# Patient Record
Sex: Male | Born: 1960 | Race: Black or African American | Hispanic: No | Smoking: Current every day smoker
Health system: Southern US, Community
[De-identification: ages and names within clinical notes are randomized; demographics above are authoritative.]

---

## 2004-03-19 ENCOUNTER — Emergency Department (HOSPITAL_COMMUNITY): Admission: EM | Admit: 2004-03-19 | Discharge: 2004-03-19 | Payer: Self-pay | Admitting: Emergency Medicine

## 2004-12-12 ENCOUNTER — Observation Stay (HOSPITAL_COMMUNITY): Admission: EM | Admit: 2004-12-12 | Discharge: 2004-12-12 | Payer: Self-pay | Admitting: Emergency Medicine

## 2005-11-30 ENCOUNTER — Emergency Department (HOSPITAL_COMMUNITY): Admission: EM | Admit: 2005-11-30 | Discharge: 2005-11-30 | Payer: Self-pay | Admitting: Emergency Medicine

## 2006-09-18 ENCOUNTER — Emergency Department (HOSPITAL_COMMUNITY): Admission: EM | Admit: 2006-09-18 | Discharge: 2006-09-18 | Payer: Self-pay | Admitting: Emergency Medicine
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
## 2008-11-07 ENCOUNTER — Emergency Department (HOSPITAL_COMMUNITY): Admission: EM | Admit: 2008-11-07 | Discharge: 2008-11-07 | Payer: Self-pay | Admitting: Emergency Medicine

## 2008-12-02 ENCOUNTER — Emergency Department (HOSPITAL_COMMUNITY): Admission: EM | Admit: 2008-12-02 | Discharge: 2008-12-02 | Payer: Self-pay | Admitting: Emergency Medicine

## 2010-08-23 LAB — CBC
HCT: 36.9 % — ABNORMAL LOW (ref 39.0–52.0)
Hemoglobin: 13.1 g/dL (ref 13.0–17.0)
MCHC: 35.4 g/dL (ref 30.0–36.0)
MCV: 86.5 fL (ref 78.0–100.0)
Platelets: 313 10*3/uL (ref 150–400)
RBC: 4.26 MIL/uL (ref 4.22–5.81)
RDW: 14.7 % (ref 11.5–15.5)
WBC: 9.2 10*3/uL (ref 4.0–10.5)

## 2010-08-23 LAB — BASIC METABOLIC PANEL
BUN: 17 mg/dL (ref 6–23)
CO2: 22 mEq/L (ref 19–32)
Calcium: 9.2 mg/dL (ref 8.4–10.5)
Chloride: 104 mEq/L (ref 96–112)
Creatinine, Ser: 1.77 mg/dL — ABNORMAL HIGH (ref 0.4–1.5)
GFR calc Af Amer: 51 mL/min — ABNORMAL LOW (ref >60)
GFR calc non Af Amer: 42 mL/min — ABNORMAL LOW (ref >60)
Glucose, Bld: 81 mg/dL (ref 70–99)
Potassium: 3.8 mEq/L (ref 3.5–5.1)
Sodium: 135 mEq/L (ref 135–145)

## 2010-08-23 LAB — RAPID URINE DRUG SCREEN, HOSP PERFORMED
Amphetamines: NOT DETECTED
Barbiturates: NOT DETECTED
Benzodiazepines: NOT DETECTED
Cocaine: POSITIVE — AB
Opiates: NOT DETECTED
Tetrahydrocannabinol: NOT DETECTED

## 2010-08-23 LAB — DIFFERENTIAL
Basophils Absolute: 0 10*3/uL (ref 0.0–0.1)
Basophils Relative: 0 % (ref 0–1)
Eosinophils Absolute: 0.1 10*3/uL (ref 0.0–0.7)
Eosinophils Relative: 1 % (ref 0–5)
Lymphocytes Relative: 22 % (ref 12–46)
Lymphs Abs: 2 10*3/uL (ref 0.7–4.0)
Monocytes Absolute: 0.6 10*3/uL (ref 0.1–1.0)
Monocytes Relative: 6 % (ref 3–12)
Neutro Abs: 6.5 10*3/uL (ref 1.7–7.7)
Neutrophils Relative %: 71 % (ref 43–77)

## 2010-08-23 LAB — URINALYSIS, ROUTINE W REFLEX MICROSCOPIC
Bilirubin Urine: NEGATIVE
Glucose, UA: NEGATIVE mg/dL
Ketones, ur: NEGATIVE mg/dL
Leukocytes, UA: NEGATIVE
Nitrite: NEGATIVE
Protein, ur: NEGATIVE mg/dL
Specific Gravity, Urine: <1.005 — ABNORMAL LOW (ref 1.005–1.030)
Urobilinogen, UA: 0.2 mg/dL (ref 0.0–1.0)
pH: 5 (ref 5.0–8.0)

## 2010-08-23 LAB — URINE MICROSCOPIC-ADD ON

## 2010-08-23 LAB — ETHANOL
Alcohol, Ethyl (B): 134 mg/dL — ABNORMAL HIGH (ref 0–10)
Alcohol, Ethyl (B): 215 mg/dL — ABNORMAL HIGH (ref 0–10)
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
## 2015-06-24 ENCOUNTER — Telehealth: Payer: Self-pay | Admitting: Orthopaedic Surgery

## 2015-06-24 MED ORDER — HYDROCODONE-ACETAMINOPHEN 7.5-325 MG PO TABS: 1.0000 | ORAL_TABLET | ORAL | Status: DC | PRN

## 2015-06-24 NOTE — Telephone Encounter (Signed)
Rx printer

## 2015-06-24 NOTE — Telephone Encounter (Signed)
Patient given prescription by Dr. Hilda Lias

## 2015-07-23 ENCOUNTER — Telehealth: Payer: Self-pay | Admitting: Orthopaedic Surgery

## 2015-07-23 MED ORDER — HYDROCODONE-ACETAMINOPHEN 7.5-325 MG PO TABS: 1.0000 | ORAL_TABLET | ORAL | Status: DC | PRN

## 2015-07-23 NOTE — Telephone Encounter (Signed)
Patient requested refill on Norco 7.5-325 mgs.  Qty 120  This was last filled on 06-24-15

## 2015-07-23 NOTE — Telephone Encounter (Signed)
Rx done. 

## 2015-08-20 ENCOUNTER — Other Ambulatory Visit: Payer: Self-pay | Admitting: Orthopaedic Surgery

## 2015-08-20 MED ORDER — HYDROCODONE-ACETAMINOPHEN 7.5-325 MG PO TABS: 1.0000 | ORAL_TABLET | ORAL | Status: DC | PRN

## 2015-09-17 ENCOUNTER — Telehealth: Payer: Self-pay | Admitting: Orthopaedic Surgery

## 2015-09-17 MED ORDER — HYDROCODONE-ACETAMINOPHEN 7.5-325 MG PO TABS: 1.0000 | ORAL_TABLET | ORAL | Status: DC | PRN

## 2015-09-17 NOTE — Telephone Encounter (Signed)
Pt requests Norco 7.5-325 mgs.

## 2015-09-17 NOTE — Telephone Encounter (Signed)
Rx done. 

## 2015-10-21 ENCOUNTER — Telehealth: Payer: Self-pay | Admitting: Orthopaedic Surgery

## 2015-10-21 MED ORDER — HYDROCODONE-ACETAMINOPHEN 7.5-325 MG PO TABS: 1.0000 | ORAL_TABLET | ORAL | Status: DC | PRN

## 2015-10-21 NOTE — Telephone Encounter (Signed)
Hydrocodone-Acetaminophen 7.5/325mg Qty 120 Tablets °

## 2015-10-21 NOTE — Telephone Encounter (Signed)
Rx done. 

## 2015-11-23 ENCOUNTER — Telehealth: Payer: Self-pay | Admitting: Orthopaedic Surgery

## 2015-11-23 MED ORDER — HYDROCODONE-ACETAMINOPHEN 7.5-325 MG PO TABS: 1.0000 | ORAL_TABLET | ORAL | Status: DC | PRN

## 2015-11-23 NOTE — Telephone Encounter (Signed)
Hydrocodone-Acetaminophen 7.5/325mg Qty 120 Tablets °

## 2015-11-23 NOTE — Telephone Encounter (Signed)
Rx done. 

## 2015-12-17 ENCOUNTER — Telehealth: Payer: Self-pay | Admitting: Orthopaedic Surgery

## 2015-12-17 NOTE — Telephone Encounter (Signed)
Patient requested a refill on Norco 7.5-325 mgs.

## 2015-12-22 ENCOUNTER — Telehealth: Payer: Self-pay

## 2015-12-22 NOTE — Telephone Encounter (Signed)
Request refill hydrocodone. 

## 2015-12-23 MED ORDER — HYDROCODONE-ACETAMINOPHEN 7.5-325 MG PO TABS: 1.0000 | ORAL_TABLET | ORAL | 0 refills | Status: DC | PRN

## 2016-01-20 ENCOUNTER — Telehealth: Payer: Self-pay | Admitting: Orthopaedic Surgery

## 2016-01-20 NOTE — Telephone Encounter (Signed)
Hydrocodone-Acetaminophen 7.5/325mg Qty 120 Tablets °

## 2016-01-21 MED ORDER — HYDROCODONE-ACETAMINOPHEN 7.5-325 MG PO TABS: ORAL_TABLET | ORAL | 0 refills | Status: DC

## 2016-02-04 ENCOUNTER — Ambulatory Visit (INDEPENDENT_AMBULATORY_CARE_PROVIDER_SITE_OTHER): Payer: Medicare Other | Admitting: Orthopaedic Surgery

## 2016-02-04 ENCOUNTER — Encounter: Payer: Self-pay | Admitting: Orthopaedic Surgery

## 2016-02-04 VITALS — BP 108/43 | HR 55 | Temp 97.7°F | Ht 72.0 in | Wt 149.0 lb

## 2016-02-04 DIAGNOSIS — M25561 Pain in right knee: Secondary | ICD-10-CM

## 2016-02-04 DIAGNOSIS — M25562 Pain in left knee: Secondary | ICD-10-CM | POA: Diagnosis not present

## 2016-02-04 DIAGNOSIS — F17210 Nicotine dependence, cigarettes, uncomplicated: Secondary | ICD-10-CM | POA: Diagnosis not present

## 2016-02-04 MED ORDER — HYDROCODONE-ACETAMINOPHEN 7.5-325 MG PO TABS: ORAL_TABLET | ORAL | 0 refills | Status: DC

## 2016-02-04 NOTE — Progress Notes (Signed)
Patient NW:GNFAO:Frank Baldwin, male DOB:12/04/1960, 55 y.o. ZHY:[id number]  Chief Complaint  Patient presents with  . Follow-up    knee pain    HPI  Frank FlamingSteve Baldwin is a 55 y.o. male who has bilateral knee pains.  He has no giving way or locking.  He has no redness.  He has some swelling and popping.  He has no new trauma. HPI  Body mass index is 20.21 kg/m.  ROS  Review of Systems  HENT: Negative for congestion.   Respiratory: Negative for cough and shortness of breath.   Cardiovascular: Negative for chest pain and leg swelling.  Endocrine: Negative for cold intolerance.  Musculoskeletal: Positive for arthralgias, gait problem and joint swelling.  Allergic/Immunologic: Negative for environmental allergies.    No past medical history on file.  No past surgical history on file.  Mother died of cancer, heart disease. Social History Social History  Substance Use Topics  . Smoking status: Current Every Day Smoker  . Smokeless tobacco: Never Used  . Alcohol use Not on file    No Known Allergies  Current Outpatient Prescriptions  Medication Sig Dispense Refill  . HYDROcodone-acetaminophen (NORCO) 7.5-325 MG tablet One every four hours for pain as needed.  Do not drive car or operate machinery while taking this medicine.  Must last 14 days. 56 tablet 0  . HYDROcodone-acetaminophen (NORCO) 7.5-325 MG tablet One every four hours for pain as needed.  Do not drive car or operate machinery while taking this medicine.  Must last 14 days. 56 tablet 0   No current facility-administered medications for this visit.      Physical Exam  Blood pressure (!) 108/43, pulse (!) 55, temperature 97.7 F (36.5 C), height 6' (1.829 m), weight 149 lb (67.6 kg).  Constitutional: overall normal hygiene, normal nutrition, well developed, normal grooming, normal body habitus. Assistive device:none  Musculoskeletal: gait and station Limp right, muscle tone and strength are normal, no tremors or atrophy  is present.  .  Neurological: coordination overall normal.  Deep tendon reflex/nerve stretch intact.  Sensation normal.  Cranial nerves II-XII intact.   Skin:   Normal overall no scars, lesions, ulcers or rashes. No psoriasis.  Psychiatric: Alert and oriented x 3.  Recent memory intact, remote memory unclear.  Normal mood and affect. Well groomed.  Good eye contact.  Cardiovascular: overall no swelling, no varicosities, no edema bilaterally, normal temperatures of the legs and arms, no clubbing, cyanosis and good capillary refill.  Lymphatic: palpation is normal.  The bilateral lower extremity is examined:  Inspection:  Thigh:  Non-tender and no defects  Knee does not have swelling 0+ effusion.                        Joint tenderness is present                        Patient is tender over the medial joint line  Lower Leg:  Has normal appearance and no tenderness or defects  Ankle:  Non-tender and no defects  Foot:  Non-tender and no defects Range of Motion:  Knee:  Range of motion is: full both knees                        Crepitus is  present  Ankle:  Range of motion is normal. Strength and Tone:  The bilateral lower extremity has normal strength and tone. Stability:  Knee:  The knee is stable.  Ankle:  The ankle is stable.    The patient has been educated about the nature of the problem(s) and counseled on treatment options.  The patient appeared to understand what I have discussed and is in agreement with it.  Encounter Diagnoses  Name Primary?  . Bilateral knee pain Yes  . Cigarette nicotine dependence without complication     PLAN Call if any problems.  Precautions discussed.  Continue current medications.   Return to clinic 3 months   Electronically Signed Darreld Mclean, MD 9/21/20173:45 PM

## 2016-02-18 ENCOUNTER — Telehealth: Payer: Self-pay | Admitting: Orthopedic Surgery

## 2016-02-18 ENCOUNTER — Other Ambulatory Visit: Payer: Self-pay | Admitting: *Deleted

## 2016-02-18 MED ORDER — HYDROCODONE-ACETAMINOPHEN 7.5-325 MG PO TABS: ORAL_TABLET | ORAL | 0 refills | Status: DC

## 2016-02-18 NOTE — Telephone Encounter (Signed)
Dr. Sanjuan DameKeeling's patient requests a refill on Hydrocodone/Acetaminophen (Norco)  7.5-325 mgs.   Qty  56       Sig: One every four hours for pain as needed. Do not drive car or operate machinery while taking this medicine. Must last 14 days.

## 2016-03-03 ENCOUNTER — Telehealth: Payer: Self-pay | Admitting: Orthopaedic Surgery

## 2016-03-03 MED ORDER — HYDROCODONE-ACETAMINOPHEN 7.5-325 MG PO TABS: ORAL_TABLET | ORAL | 0 refills | Status: DC

## 2016-03-03 NOTE — Telephone Encounter (Signed)
Patient called and requested a refill on Hydrocodone/Acetaminophen (Norco)  7.5-325 mgs.   Qty  56  Sig: One every four hours for pain as needed. Do not drive car or operate machinery while taking this medicine. Must last 14 days.

## 2016-03-23 ENCOUNTER — Telehealth: Payer: Self-pay | Admitting: Orthopaedic Surgery

## 2016-03-23 MED ORDER — HYDROCODONE-ACETAMINOPHEN 7.5-325 MG PO TABS: ORAL_TABLET | ORAL | 0 refills | Status: DC

## 2016-03-23 NOTE — Telephone Encounter (Signed)
Hydrocodone-Acetaminophen  7.5/325mg  Qty 56 Tablets °

## 2016-03-28 ENCOUNTER — Encounter: Payer: Self-pay | Admitting: Orthopaedic Surgery

## 2016-04-05 ENCOUNTER — Telehealth: Payer: Self-pay | Admitting: Orthopaedic Surgery

## 2016-04-05 MED ORDER — HYDROCODONE-ACETAMINOPHEN 7.5-325 MG PO TABS: ORAL_TABLET | ORAL | 0 refills | Status: DC

## 2016-04-05 NOTE — Telephone Encounter (Signed)
Hydrocodone-Acetaminophen   7.5/325mg   Qty 52 Tablets

## 2016-04-20 ENCOUNTER — Telehealth: Payer: Self-pay | Admitting: Orthopaedic Surgery

## 2016-04-20 MED ORDER — HYDROCODONE-ACETAMINOPHEN 7.5-325 MG PO TABS: ORAL_TABLET | ORAL | 0 refills | Status: DC

## 2016-04-20 NOTE — Telephone Encounter (Signed)
Hydrocodone-Acetaminophen   7.5/325mg  Qty 52 Tablets °

## 2016-05-04 ENCOUNTER — Ambulatory Visit (INDEPENDENT_AMBULATORY_CARE_PROVIDER_SITE_OTHER): Payer: Medicare Other | Admitting: Orthopaedic Surgery

## 2016-05-04 DIAGNOSIS — G8929 Other chronic pain: Secondary | ICD-10-CM

## 2016-05-04 DIAGNOSIS — M25561 Pain in right knee: Secondary | ICD-10-CM

## 2016-05-04 DIAGNOSIS — M25562 Pain in left knee: Secondary | ICD-10-CM

## 2016-05-04 MED ORDER — HYDROCODONE-ACETAMINOPHEN 7.5-325 MG PO TABS: ORAL_TABLET | ORAL | 0 refills | Status: DC

## 2016-05-05 ENCOUNTER — Ambulatory Visit: Payer: Medicare Other | Admitting: Orthopaedic Surgery

## 2016-05-11 NOTE — Progress Notes (Signed)
[name]:Frank Baldwin, male DOB:06/27/1960, 55 y.o. AVW:[id number]  No chief complaint on file.   HPI  Camillo FlamingSteve Bowen is a 55 y.o. male who has chronic bilateral knee pain that is stable.  He has no giving way, no locking HPI  There is no height or weight on file to calculate BMI.  ROS  Review of Systems  No past medical history on file.  No past surgical history on file.  No family history on file.  Social History Social History  Substance Use Topics  . Smoking status: Current Every Day Smoker  . Smokeless tobacco: Never Used  . Alcohol use Not on file    No Known Allergies  Current Outpatient Prescriptions  Medication Sig Dispense Refill  . HYDROcodone-acetaminophen (NORCO) 7.5-325 MG tablet One every six hours for pain as needed.  Do not drive car or operate machinery while taking this medicine.  Must last 14 days. 52 tablet 0   No current facility-administered medications for this visit.      Physical Exam  There were no vitals taken for this visit.  Constitutional: overall normal hygiene, normal nutrition, well developed, normal grooming, normal body habitus. Assistive device:none  Musculoskeletal: gait and station Limp none, muscle tone and strength are normal, no tremors or atrophy is present.  .  Neurological: coordination overall normal.  Deep tendon reflex/nerve stretch intact.  Sensation normal.  Cranial nerves II-XII intact.   Skin:   Normal overall no scars, lesions, ulcers or rashes. No psoriasis.  Psychiatric: Alert and oriented x 3.  Recent memory intact, remote memory unclear.  Normal mood and affect. Well groomed.  Good eye contact.  Cardiovascular: overall no swelling, no varicosities, no edema bilaterally, normal temperatures of the legs and arms, no clubbing, cyanosis and good capillary refill.  Lymphatic: palpation is normal.  Knees have full motion but crepitus in the right knee and sensitive.  Gait is normal.  NV intact.  The patient  has been educated about the nature of the problem(s) and counseled on treatment options.  The patient appeared to understand what I have discussed and is in agreement with it.  Encounter Diagnosis  Name Primary?  . Chronic pain of both knees Yes    PLAN Call if any problems.  Precautions discussed.  Continue current medications.   Return to clinic 3 months   Electronically Signed Darreld McleanWayne Mishawn Hemann, MD 12/27/20172:35 PM

## 2016-05-18 ENCOUNTER — Telehealth: Payer: Self-pay | Admitting: Orthopaedic Surgery

## 2016-05-18 MED ORDER — HYDROCODONE-ACETAMINOPHEN 7.5-325 MG PO TABS: ORAL_TABLET | ORAL | 0 refills | Status: DC

## 2016-05-18 NOTE — Telephone Encounter (Signed)
Hydrocodone-Acetaminophen   7.5/325mg  Qty 52 Tablets °

## 2016-06-03 ENCOUNTER — Telehealth: Payer: Self-pay | Admitting: Orthopaedic Surgery

## 2016-06-03 MED ORDER — HYDROCODONE-ACETAMINOPHEN 7.5-325 MG PO TABS: ORAL_TABLET | ORAL | 0 refills | Status: DC

## 2016-06-03 NOTE — Telephone Encounter (Signed)
Patient requests refill on Hydrocodone/Acetaminophen (Norco)  7.5-325  Mgs.  Qty  52   Sig: One every six hours for pain as needed. Do not drive car or operate machinery while taking this medicine. Must last 14 days.

## 2016-06-16 ENCOUNTER — Telehealth: Payer: Self-pay | Admitting: Orthopaedic Surgery

## 2016-06-16 MED ORDER — HYDROCODONE-ACETAMINOPHEN 7.5-325 MG PO TABS: ORAL_TABLET | ORAL | 0 refills | Status: DC

## 2016-06-16 NOTE — Telephone Encounter (Signed)
Patient requests refill on Hydrocodone/Acetaminophen 7.5-325  Mgs.   Qty  52   Sig: One every six hours for pain as needed. Do not drive car or operate machinery while taking this medicine. Must last 14 days.

## 2016-06-30 ENCOUNTER — Telehealth: Payer: Self-pay | Admitting: Orthopaedic Surgery

## 2016-06-30 MED ORDER — HYDROCODONE-ACETAMINOPHEN 7.5-325 MG PO TABS: ORAL_TABLET | ORAL | 0 refills | Status: DC

## 2016-06-30 NOTE — Telephone Encounter (Signed)
Patient called and requested a refill on Hydrocodone/Acetaminophen (Norco)  7.5-*325   Mgs.   Qty  52       Sig: One every six hours for pain as needed. Do not drive car or operate machinery while taking this medicine. Must last 14 days.

## 2016-07-14 ENCOUNTER — Telehealth: Payer: Self-pay | Admitting: Orthopaedic Surgery

## 2016-07-14 MED ORDER — HYDROCODONE-ACETAMINOPHEN 7.5-325 MG PO TABS: ORAL_TABLET | ORAL | 0 refills | Status: DC

## 2016-07-14 NOTE — Telephone Encounter (Signed)
Patient requests a refill on Hydrocodone/Acetaminophen 7.5-325  Mgs.   Qty  50   Sig: One every six hours for pain as needed. Do not drive car or operate machinery while taking this medicine. Must last 14 days.

## 2016-07-28 ENCOUNTER — Encounter: Payer: Self-pay | Admitting: Orthopaedic Surgery

## 2016-07-28 ENCOUNTER — Ambulatory Visit (INDEPENDENT_AMBULATORY_CARE_PROVIDER_SITE_OTHER): Payer: Medicare Other | Admitting: Orthopaedic Surgery

## 2016-07-28 VITALS — BP 128/72 | HR 50 | Ht 73.0 in | Wt 153.0 lb

## 2016-07-28 DIAGNOSIS — S61412A Laceration without foreign body of left hand, initial encounter: Secondary | ICD-10-CM

## 2016-07-28 DIAGNOSIS — F17210 Nicotine dependence, cigarettes, uncomplicated: Secondary | ICD-10-CM | POA: Diagnosis not present

## 2016-07-28 MED ORDER — CEPHALEXIN 500 MG PO CAPS: 500.0000 mg | ORAL_CAPSULE | Freq: Four times a day (QID) | ORAL | 0 refills | Status: DC

## 2016-07-28 MED ORDER — HYDROCODONE-ACETAMINOPHEN 7.5-325 MG PO TABS: ORAL_TABLET | ORAL | 0 refills | Status: DC

## 2016-07-28 NOTE — Progress Notes (Addendum)
Patient Frank Baldwin:[name], male DOB:06/20/1960, 56 y.o. AVW:[id number]  Chief Complaint  Patient presents with  . Hand Injury    LEFT HAND LACERATION, DOI 07/25/16    HPI  Frank Baldwin is a 56 y.o. male who cut his hand about ten to eleven days ago on the left in the volar area at the base of the ring finger.  It was a deep cut.  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  He has used peroxide and alcohol to the area.  He has been concerned because it has a little odor now and it is not coming together.  He feels it should have had sutures but he is afraid of needles.  He is a smoker and not willing to quit. HPI  Body mass index is 20.19 kg/m.  ROS  Review of Systems  HENT: Negative for congestion.   Respiratory: Negative for cough and shortness of breath.   Cardiovascular: Negative for chest pain and leg swelling.  Endocrine: Negative for cold intolerance.  Musculoskeletal: Positive for arthralgias, gait problem and joint swelling.  Allergic/Immunologic: Negative for environmental allergies.    No past medical history on file.  No past surgical history on file.  No family history on file.  Social History Social History  Substance Use Topics  . Smoking status: Current Every Day Smoker  . Smokeless tobacco: Never Used  . Alcohol use Not on file    No Known Allergies  Current Outpatient Prescriptions  Medication Sig Dispense Refill  . cephALEXin (KEFLEX) 500 MG capsule Take 1 capsule (500 mg total) by mouth 4 (four) times daily. 40 capsule 0  . HYDROcodone-acetaminophen (NORCO) 7.5-325 MG tablet One every six hours for pain as needed.  Do not drive car or operate machinery while taking this medicine.  Must last 14 days. 50 tablet 0   No current facility-administered medications for this visit.      Physical Exam  Blood pressure 128/72, pulse (!) 50, height 6\' 1"  (1.854 m), weight 153 lb (69.4 kg).  Constitutional: overall normal  hygiene, normal nutrition, well developed, normal grooming, normal body habitus. Assistive device:none  Musculoskeletal: gait and station Limp none, muscle tone and strength are normal, no tremors or atrophy is present.  .  Neurological: coordination overall normal.  Deep tendon reflex/nerve stretch intact.  Sensation normal.  Cranial nerves II-XII intact.   Skin:   Normal overall no scars, lesions, ulcers or rashes. No psoriasis.  Psychiatric: Alert and oriented x 3.  Recent memory intact, remote memory unclear.  Normal mood and affect. Well groomed.  Good eye contact.  Cardiovascular: overall no swelling, no varicosities, no edema bilaterally, normal temperatures of the legs and arms, no clubbing, cyanosis and good capillary refill.  Lymphatic: palpation is normal.  He has a deep cut volar side of the left nondominant hand that is at the base of the ring finger and extending deep into the web between the ring and the little finger.  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  He has no ulcers, color is normal.  There is no odor noted but it was just cleansed. No purulence is present. ROM of the ring is good as the other fingers and thumb.  There is no swelling of the palm or fluctuance.  There is no swelling of the wrist.  The patient has been educated about  the nature of the problem(s) and counseled on treatment options.  The patient appeared to understand what I have discussed and is in agreement with it.  Encounter Diagnoses  Name Primary?  . Laceration of left hand without foreign body, initial encounter Yes  . Cigarette nicotine dependence without complication     PLAN Call if any problems.  Precautions discussed. I will begin Keflex.  Return to clinic 1 week  I have reviewed the [facility] Controlled Substance Reporting System web site prior to prescribing narcotic medicine for this  patient.   Electronically Signed Darreld Mclean, MD 3/15/201811:51 AM

## 2016-08-04 ENCOUNTER — Ambulatory Visit (INDEPENDENT_AMBULATORY_CARE_PROVIDER_SITE_OTHER): Payer: Medicare Other | Admitting: Orthopaedic Surgery

## 2016-08-04 ENCOUNTER — Encounter: Payer: Self-pay | Admitting: Orthopaedic Surgery

## 2016-08-04 VITALS — BP 115/70 | HR 52 | Temp 97.3°F | Ht 73.0 in | Wt 150.0 lb

## 2016-08-04 DIAGNOSIS — S61412A Laceration without foreign body of left hand, initial encounter: Secondary | ICD-10-CM | POA: Diagnosis not present

## 2016-08-04 NOTE — Progress Notes (Signed)
Patient Frank Baldwin, male DOB:03/29/1961, 56 y.o. NWG:[id number]  Chief Complaint  Patient presents with  . Follow-up    left hand laceration    HPI  Camillo FlamingSteve Fusselman is a 56 y.o. male who had a deep laceration of the left hand at the base of the ring finger.  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  He has been on the Keflex and has been doing wound care. He has no odor of the finger today and no discharge.  HPI  Body mass index is 19.79 kg/m.  ROS  Review of Systems  HENT: Negative for congestion.   Respiratory: Negative for cough and shortness of breath.   Cardiovascular: Negative for chest pain and leg swelling.  Endocrine: Negative for cold intolerance.  Musculoskeletal: Positive for arthralgias, gait problem and joint swelling.  Allergic/Immunologic: Negative for environmental allergies.    No past medical history on file.  No past surgical history on file.  No family history on file.  Social History Social History  Substance Use Topics  . Smoking status: Current Every Day Smoker  . Smokeless tobacco: Never Used  . Alcohol use Not on file    No Known Allergies  Current Outpatient Prescriptions  Medication Sig Dispense Refill  . cephALEXin (KEFLEX) 500 MG capsule Take 1 capsule (500 mg total) by mouth 4 (four) times daily. 40 capsule 0  . HYDROcodone-acetaminophen (NORCO) 7.5-325 MG tablet One every six hours for pain as needed.  Do not drive car or operate machinery while taking this medicine.  Must last 14 days. 50 tablet 0   No current facility-administered medications for this visit.      Physical Exam  Blood pressure 115/70, pulse (!) 52, temperature 97.3 F (36.3 C), height 6\' 1"  (1.854 m), weight 150 lb (68 kg).  Constitutional: overall normal hygiene, normal nutrition, well developed, normal grooming, normal body habitus. Assistive device:none  Musculoskeletal: gait and  station Limp none, muscle tone and strength are normal, no tremors or atrophy is present.  .  Neurological: coordination overall normal.  Deep tendon reflex/nerve stretch intact.  Sensation normal.  Cranial nerves II-XII intact.   Skin:   Normal overall no scars, lesions, ulcers or rashes. No psoriasis.  Psychiatric: Alert and oriented x 3.  Recent memory intact, remote memory unclear.  Normal mood and affect. Well groomed.  Good eye contact.  Cardiovascular: overall no swelling, no varicosities, no edema bilaterally, normal temperatures of the legs and arms, no clubbing, cyanosis and good capillary refill.  Lymphatic: palpation is normal.  The deep laceration of the left hand on volar side of the ring finger is not red, there is no discharge and no odor.  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    The patient has been educated about the nature of the problem(s) and counseled on treatment options.  The patient appeared to understand what I have discussed and is in agreement with it.  Encounter Diagnosis  Name Primary?  . Laceration of left hand without foreign body, initial encounter Yes    PLAN Call if any problems.  Precautions discussed.  Continue current medications.   Return to clinic 2 weeks   Continue antibiotics until gone.  Electronically Signed Darreld McleanWayne Kanton Kamel, MD 3/22/20183:43 PM

## 2016-08-04 NOTE — Patient Instructions (Signed)
Steps to Quit Smoking Smoking tobacco can be bad for your health. It can also affect almost every organ in your body. Smoking puts you and people around you at risk for many serious long-lasting (chronic) diseases. Quitting smoking is hard, but it is one of the best things that you can do for your health. It is never too late to quit. What are the benefits of quitting smoking? When you quit smoking, you lower your risk for getting serious diseases and conditions. They can include:  Lung cancer or lung disease.  Heart disease.  Stroke.  Heart attack.  Not being able to have children (infertility).  Weak bones (osteoporosis) and broken bones (fractures). If you have coughing, wheezing, and shortness of breath, those symptoms may get better when you quit. You may also get sick less often. If you are pregnant, quitting smoking can help to lower your chances of having a baby of low birth weight. What can I do to help me quit smoking? Talk with your doctor about what can help you quit smoking. Some things you can do (strategies) include:  Quitting smoking totally, instead of slowly cutting back how much you smoke over a period of time.  Going to in-person counseling. You are more likely to quit if you go to many counseling sessions.  Using resources and support systems, such as:  Online chats with a counselor.  Phone quitlines.  Printed self-help materials.  Support groups or group counseling.  Text messaging programs.  Mobile phone apps or applications.  Taking medicines. Some of these medicines may have nicotine in them. If you are pregnant or breastfeeding, do not take any medicines to quit smoking unless your doctor says it is okay. Talk with your doctor about counseling or other things that can help you. Talk with your doctor about using more than one strategy at the same time, such as taking medicines while you are also going to in-person counseling. This can help make quitting  easier. What things can I do to make it easier to quit? Quitting smoking might feel very hard at first, but there is a lot that you can do to make it easier. Take these steps:  Talk to your family and friends. Ask them to support and encourage you.  Call phone quitlines, reach out to support groups, or work with a counselor.  Ask people who smoke to not smoke around you.  Avoid places that make you want (trigger) to smoke, such as:  Bars.  Parties.  Smoke-break areas at work.  Spend time with people who do not smoke.  Lower the stress in your life. Stress can make you want to smoke. Try these things to help your stress:  Getting regular exercise.  Deep-breathing exercises.  Yoga.  Meditating.  Doing a body scan. To do this, close your eyes, focus on one area of your body at a time from head to toe, and notice which parts of your body are tense. Try to relax the muscles in those areas.  Download or buy apps on your mobile phone or tablet that can help you stick to your quit plan. There are many free apps, such as QuitGuide from the CDC (Centers for Disease Control and Prevention). You can find more support from smokefree.gov and other websites. This information is not intended to replace advice given to you by your health care provider. Make sure you discuss any questions you have with your health care provider. Document Released: 02/26/2009 Document Revised: 12/29/2015 Document   Reviewed: 09/16/2014 Elsevier Interactive Patient Education  2017 Elsevier Inc.  

## 2016-08-11 ENCOUNTER — Telehealth: Payer: Self-pay | Admitting: Orthopaedic Surgery

## 2016-08-11 MED ORDER — HYDROCODONE-ACETAMINOPHEN 7.5-325 MG PO TABS: ORAL_TABLET | ORAL | 0 refills | Status: DC

## 2016-08-11 NOTE — Telephone Encounter (Signed)
Hydrocodone-Acetaminophen  7.5/325 mg  Qty 50 Tablets °

## 2016-08-18 ENCOUNTER — Ambulatory Visit: Payer: Medicare Other | Admitting: Orthopaedic Surgery

## 2016-08-30 ENCOUNTER — Ambulatory Visit (INDEPENDENT_AMBULATORY_CARE_PROVIDER_SITE_OTHER): Payer: Medicare Other | Admitting: Orthopaedic Surgery

## 2016-08-30 ENCOUNTER — Encounter: Payer: Self-pay | Admitting: Orthopaedic Surgery

## 2016-08-30 VITALS — BP 102/76 | HR 82 | Temp 97.9°F | Ht 73.0 in | Wt 149.0 lb

## 2016-08-30 DIAGNOSIS — G8929 Other chronic pain: Secondary | ICD-10-CM | POA: Diagnosis not present

## 2016-08-30 DIAGNOSIS — M25561 Pain in right knee: Secondary | ICD-10-CM

## 2016-08-30 DIAGNOSIS — M25562 Pain in left knee: Secondary | ICD-10-CM

## 2016-08-30 DIAGNOSIS — S61412D Laceration without foreign body of left hand, subsequent encounter: Secondary | ICD-10-CM

## 2016-08-30 DIAGNOSIS — S61412A Laceration without foreign body of left hand, initial encounter: Secondary | ICD-10-CM

## 2016-08-30 DIAGNOSIS — S61419A Laceration without foreign body of unspecified hand, initial encounter: Secondary | ICD-10-CM | POA: Insufficient documentation

## 2016-08-30 MED ORDER — HYDROCODONE-ACETAMINOPHEN 7.5-325 MG PO TABS: ORAL_TABLET | ORAL | 0 refills | Status: DC

## 2016-08-30 NOTE — Progress Notes (Signed)
Patient Frank Baldwin, male DOB:1961/04/14, 56 y.o. [id number]  Chief Complaint  Patient presents with  . Follow-up    LEFT HAND LACERATION    HPI  Harper Smoker is a 56 y.o. male who had a deep laceration to the left hand near the ring finger into the palm.  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  He has been doing well.  The hand has healed completely.  His knees are not that tender today. HPI  Body mass index is 19.66 kg/m.  ROS  Review of Systems  HENT: Negative for congestion.   Respiratory: Negative for cough and shortness of breath.   Cardiovascular: Negative for chest pain and leg swelling.  Endocrine: Negative for cold intolerance.  Musculoskeletal: Positive for arthralgias, gait problem and joint swelling.  Allergic/Immunologic: Negative for environmental allergies.    No past medical history on file.  No past surgical history on file.  No family history on file.  Social History Social History  Substance Use Topics  . Smoking status: Current Every Day Smoker  . Smokeless tobacco: Never Used  . Alcohol use Not on file    No Known Allergies  Current Outpatient Prescriptions  Medication Sig Dispense Refill  . cephALEXin (KEFLEX) 500 MG capsule Take 1 capsule (500 mg total) by mouth 4 (four) times daily. 40 capsule 0  . HYDROcodone-acetaminophen (NORCO) 7.5-325 MG tablet One every six hours for pain as needed.  Do not drive car or operate machinery while taking this medicine.  Must last 14 days. 45 tablet 0   No current facility-administered medications for this visit.      Physical Exam  Blood pressure 102/76, pulse 82, temperature 97.9 F (36.6 C), height  (1.854 m), weight 149 lb (67.6 kg).  Constitutional: overall normal hygiene, normal nutrition, well developed, normal grooming, normal body habitus. Assistive device:none  Musculoskeletal: gait and station Limp none, muscle tone and strength are normal, no tremors  or atrophy is present.  .  Neurological: coordination overall normal.  Deep tendon reflex/nerve stretch intact.  Sensation normal.  Cranial nerves II-XII intact.   Skin:   Normal overall no scars, lesions, ulcers or rashes. No psoriasis.  Psychiatric: Alert and oriented x 3.  Recent memory intact, remote memory unclear.  Normal mood and affect. Well groomed.  Good eye contact.  Cardiovascular: overall no swelling, no varicosities, no edema bilaterally, normal temperatures of the legs and arms, no clubbing, cyanosis and good capillary refill.  Lymphatic: palpation is normal.  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  The patient appeared to understand what I have discussed and is in agreement with it.  Encounter Diagnoses  Name Primary?  . Laceration of left hand without foreign body, initial encounter Yes  . Chronic pain of both knees     PLAN Call if any problems.  Precautions discussed.  Continue current medications.   Return to clinic 3 months   I have reviewed the [facility] Controlled Substance Reporting System web site prior to prescribing narcotic medicine for this patient.  Electronically Signed Darreld Mclean, MD 4/17/20183:56 PM

## 2016-09-22 ENCOUNTER — Telehealth: Payer: Self-pay | Admitting: Orthopaedic Surgery

## 2016-09-22 MED ORDER — HYDROCODONE-ACETAMINOPHEN 7.5-325 MG PO TABS: ORAL_TABLET | ORAL | 0 refills | Status: DC

## 2016-09-22 NOTE — Telephone Encounter (Signed)
Patient requests refill on Hydrocodone/Acetaminophen (Norco)  7.5-325  mgs.   Qty  45       Sig: One every six hours for pain as needed. Do not drive car or operate machinery while taking this medicine. Must last 14 days.

## 2016-10-06 ENCOUNTER — Telehealth: Payer: Self-pay | Admitting: Orthopaedic Surgery

## 2016-10-06 MED ORDER — HYDROCODONE-ACETAMINOPHEN 7.5-325 MG PO TABS: ORAL_TABLET | ORAL | 0 refills | Status: DC

## 2016-10-06 NOTE — Telephone Encounter (Signed)
Patient requests refill on Hydrocodone/Acetamiophen 7.5-325 mgs.  Qty  45  Sig: One every six hours for pain as needed. Do not drive car or operate machinery while taking this medicine. Must last 14 days

## 2016-10-27 ENCOUNTER — Telehealth: Payer: Self-pay | Admitting: Orthopaedic Surgery

## 2016-10-27 NOTE — Telephone Encounter (Signed)
Patient requests refill on Hydrocodone/Acetaminophen (Norco)  Qty  42  Sig: One every six hours for pain as needed. Do not drive car or operate machinery while taking this medicine. Must last 14 days.

## 2016-10-31 MED ORDER — HYDROCODONE-ACETAMINOPHEN 7.5-325 MG PO TABS: ORAL_TABLET | ORAL | 0 refills | Status: DC

## 2016-11-23 ENCOUNTER — Telehealth: Payer: Self-pay | Admitting: Orthopaedic Surgery

## 2016-11-23 NOTE — Telephone Encounter (Signed)
Patient requests refill on Hydrocodone/Acetaminophen 7.5-325 mgs.   Qty  38   Sig: One every six hours for pain as needed. Do not drive car or operate machinery while taking this medicine. Must last 14 days.

## 2016-11-24 MED ORDER — HYDROCODONE-ACETAMINOPHEN 7.5-325 MG PO TABS: ORAL_TABLET | ORAL | 0 refills | Status: DC

## 2016-11-29 ENCOUNTER — Ambulatory Visit: Payer: Medicare Other | Admitting: Orthopaedic Surgery

## 2017-02-22 ENCOUNTER — Ambulatory Visit: Payer: Medicare Other | Admitting: Orthopaedic Surgery

## 2017-02-22 ENCOUNTER — Encounter: Payer: Self-pay | Admitting: Orthopaedic Surgery

## 2017-02-28 ENCOUNTER — Ambulatory Visit (INDEPENDENT_AMBULATORY_CARE_PROVIDER_SITE_OTHER): Payer: Medicare Other | Admitting: Orthopaedic Surgery

## 2017-02-28 ENCOUNTER — Encounter: Payer: Self-pay | Admitting: Orthopaedic Surgery

## 2017-02-28 VITALS — BP 114/74 | HR 46 | Temp 96.8°F | Ht 73.0 in | Wt 150.0 lb

## 2017-02-28 DIAGNOSIS — M25561 Pain in right knee: Secondary | ICD-10-CM | POA: Diagnosis not present

## 2017-02-28 DIAGNOSIS — G5622 Lesion of ulnar nerve, left upper limb: Secondary | ICD-10-CM | POA: Diagnosis not present

## 2017-02-28 DIAGNOSIS — M25562 Pain in left knee: Secondary | ICD-10-CM

## 2017-02-28 DIAGNOSIS — F17210 Nicotine dependence, cigarettes, uncomplicated: Secondary | ICD-10-CM | POA: Diagnosis not present

## 2017-02-28 DIAGNOSIS — G8929 Other chronic pain: Secondary | ICD-10-CM

## 2017-02-28 MED ORDER — HYDROCODONE-ACETAMINOPHEN 7.5-325 MG PO TABS: ORAL_TABLET | ORAL | 0 refills | Status: DC

## 2017-02-28 NOTE — Progress Notes (Signed)
Patient Frank Baldwin, male DOB:1961-01-03, 56 y.o. [id number]  Chief Complaint  Patient presents with  . Follow-up    Left hand    HPI  Maxie Slovacek is a 56 y.o. 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  He had a deep laceration of the left hand that has resolved.  He has pain of both knees with crepitus and swelling.  He has no new trauma, no giving way or redness.  He has more pain of the right knee.   HPI  Body mass index is 19.79 kg/m.  ROS  Review of Systems  HENT: Negative for congestion.   Respiratory: Negative for cough and shortness of breath.   Cardiovascular: Negative for chest pain and leg swelling.  Endocrine: Negative for cold intolerance.  Musculoskeletal: Positive for arthralgias, gait problem and joint swelling.  Allergic/Immunologic: Negative for environmental allergies.    History reviewed. No pertinent past medical history.  History reviewed. No pertinent surgical history.  History reviewed. No pertinent family history.  Social History Social History  Substance Use Topics  . Smoking status: Current Every Day Smoker  . Smokeless tobacco: Never Used  . Alcohol use Not on file    No Known Allergies  Current Outpatient Prescriptions  Medication Sig Dispense Refill  . cephALEXin (KEFLEX) 500 MG capsule Take 1 capsule (500 mg total) by mouth 4 (four) times daily. 40 capsule 0  . HYDROcodone-acetaminophen (NORCO) 7.5-325 MG tablet One every six hours for pain as needed.  Do not drive car or operate machinery while taking this medicine.  Must last 14 days. 38 tablet 0   No current facility-administered medications for this visit.      Physical Exam  Blood pressure 114/74, pulse (!) 46, temperature (!) 96.8 F (36 C), height  (1.854 m), weight 150 lb (68 kg).  Constitutional: overall normal hygiene, normal nutrition, well developed, normal grooming, normal body habitus. Assistive  device:none  Musculoskeletal: gait and station Limp right, muscle tone and strength are normal, no tremors or atrophy is present.  .  Neurological: coordination overall normal.  Deep tendon reflex/nerve stretch intact.  Sensation normal except for the left hand which has no ulnar nerve sensation and defect flexion of the little finger.  Cranial nerves II-XII intact.   Skin:   Normal overall no scars, lesions, ulcers or rashes. No psoriasis.  Psychiatric: Alert and oriented x 3.  Recent memory intact, remote memory unclear.  Normal mood and affect. Well groomed.  Good eye contact.  Cardiovascular: overall no swelling, no varicosities, no edema bilaterally, normal temperatures of the legs and arms, no clubbing, cyanosis and good capillary refill.  Lymphatic: palpation is normal.  The bilateral lower extremity is examined:  Inspection:  Thigh:  Non-tender and no defects  Knee has swelling 1/2+ effusion.                        Joint tenderness is present                        Patient is tender over the medial joint line  Lower Leg:  Has normal appearance and no tenderness or defects  Ankle:  Non-tender and no defects  Foot:  Non-tender and no defects Range of Motion:  Knee:  Range of motion is: 0-110 right, 0 to 115 left  Crepitus is  present  Ankle:  Range of motion is normal. Strength and Tone:  The bilateral lower extremity has normal strength and tone. Stability:  Knee:  The knee is stable.  Ankle:  The ankle is stable.   All other systems reviewed and are negative   The patient has been educated about the nature of the problem(s) and counseled on treatment options.  The patient appeared to understand what I have discussed and is in agreement with it.  Encounter Diagnoses  Name Primary?  . Chronic pain of both knees Yes  . 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 Cigarette nicotine dependence without complication     PLAN Call if any  problems.  Precautions discussed.  Continue current medications.   Return to clinic 3 months   I have reviewed the [facility] Controlled Substance Reporting System web site prior to prescribing narcotic medicine for this patient.  Electronically Signed Darreld Mclean, MD 10/16/20188:42 AM

## 2017-02-28 NOTE — Patient Instructions (Signed)
Steps to Quit Smoking Smoking tobacco can be bad for your health. It can also affect almost every organ in your body. Smoking puts you and people around you at risk for many serious [place]-lasting (chronic) diseases. Quitting smoking is hard, but it is one of the best things that you can do for your health. It is never too late to quit. What are the benefits of quitting smoking? When you quit smoking, you lower your risk for getting serious diseases and conditions. They can include:  Lung cancer or lung disease.  Heart disease.  Stroke.  Heart attack.  Not being able to have children (infertility).  Weak bones (osteoporosis) and broken bones (fractures).  If you have coughing, wheezing, and shortness of breath, those symptoms may get better when you quit. You may also get sick less often. If you are pregnant, quitting smoking can help to lower your chances of having a baby of low birth weight. What can I do to help me quit smoking? Talk with your doctor about what can help you quit smoking. Some things you can do (strategies) include:  Quitting smoking totally, instead of slowly cutting back how much you smoke over a period of time.  Going to in-person counseling. You are more likely to quit if you go to many counseling sessions.  Using resources and support systems, such as: ? Online chats with a counselor. ? Phone quitlines. ? Printed self-help materials. ? Support groups or group counseling. ? Text messaging programs. ? Mobile phone apps or applications.  Taking medicines. Some of these medicines may have nicotine in them. If you are pregnant or breastfeeding, do not take any medicines to quit smoking unless your doctor says it is okay. Talk with your doctor about counseling or other things that can help you.  Talk with your doctor about using more than one strategy at the same time, such as taking medicines while you are also going to in-person counseling. This can help make  quitting easier. What things can I do to make it easier to quit? Quitting smoking might feel very hard at first, but there is a lot that you can do to make it easier. Take these steps:  Talk to your family and friends. Ask them to support and encourage you.  Call phone quitlines, reach out to support groups, or work with a counselor.  Ask people who smoke to not smoke around you.  Avoid places that make you want (trigger) to smoke, such as: ? Bars. ? Parties. ? Smoke-break areas at work.  Spend time with people who do not smoke.  Lower the stress in your life. Stress can make you want to smoke. Try these things to help your stress: ? Getting regular exercise. ? Deep-breathing exercises. ? Yoga. ? Meditating. ? Doing a body scan. To do this, close your eyes, focus on one area of your body at a time from head to toe, and notice which parts of your body are tense. Try to relax the muscles in those areas.  Download or buy apps on your mobile phone or tablet that can help you stick to your quit plan. There are many free apps, such as QuitGuide from the CDC (Centers for Disease Control and Prevention). You can find more support from smokefree.gov and other websites.  This information is not intended to replace advice given to you by your health care provider. Make sure you discuss any questions you have with your health care provider. Document Released: 02/26/2009 Document   Revised: 12/29/2015 Document Reviewed: 09/16/2014 Elsevier Interactive Patient Education  2018 Elsevier Inc.  

## 2017-03-16 ENCOUNTER — Telehealth: Payer: Self-pay | Admitting: Orthopaedic Surgery

## 2017-03-16 MED ORDER — HYDROCODONE-ACETAMINOPHEN 7.5-325 MG PO TABS: ORAL_TABLET | ORAL | 0 refills | Status: DC

## 2017-03-16 NOTE — Telephone Encounter (Signed)
Hydrocodone-Acetaminophen  7.5/325 mg  Qty 38 Tablets °

## 2017-03-28 ENCOUNTER — Ambulatory Visit: Payer: Medicare Other | Admitting: Orthopaedic Surgery

## 2017-03-30 ENCOUNTER — Ambulatory Visit (INDEPENDENT_AMBULATORY_CARE_PROVIDER_SITE_OTHER): Payer: Medicare Other | Admitting: Orthopaedic Surgery

## 2017-03-30 ENCOUNTER — Encounter: Payer: Self-pay | Admitting: Orthopaedic Surgery

## 2017-03-30 VITALS — BP 133/85 | HR 55 | Temp 96.8°F | Ht 73.0 in | Wt 148.0 lb

## 2017-03-30 DIAGNOSIS — G5622 Lesion of ulnar nerve, left upper limb: Secondary | ICD-10-CM

## 2017-03-30 DIAGNOSIS — F17210 Nicotine dependence, cigarettes, uncomplicated: Secondary | ICD-10-CM | POA: Diagnosis not present

## 2017-03-30 MED ORDER — HYDROCODONE-ACETAMINOPHEN 7.5-325 MG PO TABS: ORAL_TABLET | ORAL | 0 refills | Status: DC

## 2017-03-30 NOTE — Patient Instructions (Signed)
Steps to Quit Smoking Smoking tobacco can be bad for your health. It can also affect almost every organ in your body. Smoking puts you and people around you at risk for many serious long-lasting (chronic) diseases. Quitting smoking is hard, but it is one of the best things that you can do for your health. It is never too late to quit. What are the benefits of quitting smoking? When you quit smoking, you lower your risk for getting serious diseases and conditions. They can include:  Lung cancer or lung disease.  Heart disease.  Stroke.  Heart attack.  Not being able to have children (infertility).  Weak bones (osteoporosis) and broken bones (fractures).  If you have coughing, wheezing, and shortness of breath, those symptoms may get better when you quit. You may also get sick less often. If you are pregnant, quitting smoking can help to lower your chances of having a baby of low birth weight. What can I do to help me quit smoking? Talk with your doctor about what can help you quit smoking. Some things you can do (strategies) include:  Quitting smoking totally, instead of slowly cutting back how much you smoke over a period of time.  Going to in-person counseling. You are more likely to quit if you go to many counseling sessions.  Using resources and support systems, such as: ? Online chats with a counselor. ? Phone quitlines. ? Printed self-help materials. ? Support groups or group counseling. ? Text messaging programs. ? Mobile phone apps or applications.  Taking medicines. Some of these medicines may have nicotine in them. If you are pregnant or breastfeeding, do not take any medicines to quit smoking unless your doctor says it is okay. Talk with your doctor about counseling or other things that can help you.  Talk with your doctor about using more than one strategy at the same time, such as taking medicines while you are also going to in-person counseling. This can help make  quitting easier. What things can I do to make it easier to quit? Quitting smoking might feel very hard at first, but there is a lot that you can do to make it easier. Take these steps:  Talk to your family and friends. Ask them to support and encourage you.  Call phone quitlines, reach out to support groups, or work with a counselor.  Ask people who smoke to not smoke around you.  Avoid places that make you want (trigger) to smoke, such as: ? Bars. ? Parties. ? Smoke-break areas at work.  Spend time with people who do not smoke.  Lower the stress in your life. Stress can make you want to smoke. Try these things to help your stress: ? Getting regular exercise. ? Deep-breathing exercises. ? Yoga. ? Meditating. ? Doing a body scan. To do this, close your eyes, focus on one area of your body at a time from head to toe, and notice which parts of your body are tense. Try to relax the muscles in those areas.  Download or buy apps on your mobile phone or tablet that can help you stick to your quit plan. There are many free apps, such as QuitGuide from the CDC (Centers for Disease Control and Prevention). You can find more support from smokefree.gov and other websites.  This information is not intended to replace advice given to you by your health care provider. Make sure you discuss any questions you have with your health care provider. Document Released: 02/26/2009 Document   Revised: 12/29/2015 Document Reviewed: 09/16/2014 Elsevier Interactive Patient Education  2018 Elsevier Inc.  

## 2017-03-30 NOTE — Progress Notes (Signed)
[name]:Frank Baldwin, male DOB:01/22/1961, 56 y.o. [id number]  Chief Complaint  Patient presents with  . Hand Pain    left    HPI  Camillo FlamingSteve Baldwin is a 56 y.o. 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  He had a deep laceration of the hand earlier this year and it has healed.  He has no new injury.  He has pain of the knees, more on the left. HPI  Body mass index is 19.53 kg/m.  ROS  Review of Systems  HENT: Negative for congestion.   Respiratory: Negative for cough and shortness of breath.   Cardiovascular: Negative for chest pain and leg swelling.  Endocrine: Negative for cold intolerance.  Musculoskeletal: Positive for arthralgias, gait problem and joint swelling.  Allergic/Immunologic: Negative for environmental allergies.    History reviewed. No pertinent past medical history.  History reviewed. No pertinent surgical history.  History reviewed. No pertinent family history.  Social History Social History   Tobacco Use  . Smoking status: Current Every Day Smoker  . Smokeless tobacco: Never Used  Substance Use Topics  . Alcohol use: Not on file  . Drug use: Not on file    No Known Allergies  Current Outpatient Medications  Medication Sig Dispense Refill  . HYDROcodone-acetaminophen (NORCO) 7.5-325 MG tablet One every six hours for pain as needed.  Do not drive car or operate machinery while taking this medicine.  Must last 14 days. 38 tablet 0   No current facility-administered medications for this visit.      Physical Exam  Blood pressure 133/85, pulse (!) 55, temperature (!) 96.8 F (36 C), height 6\' 1"  (1.854 m), weight 148 lb (67.1 kg).  Constitutional: overall normal hygiene, normal nutrition, well developed, normal grooming, normal body habitus. Assistive device:none  Musculoskeletal: gait and station Limp none, muscle tone and strength are normal, no tremors or atrophy is  present.  .  Neurological: coordination overall normal.  Deep tendon reflex/nerve stretch intact.  Sensation normal.  Cranial nerves II-XII intact.   Skin:   Normal overall no scars, lesions, ulcers or rashes. No psoriasis.  Psychiatric: Alert and oriented x 3.  Recent memory intact, remote memory unclear.  Normal mood and affect. Well groomed.  Good eye contact.  Cardiovascular: overall no swelling, no varicosities, no edema bilaterally, normal temperatures of the legs and arms, no clubbing, cyanosis and good capillary refill.  Lymphatic: palpation is normal.  All other systems reviewed and are negative   Left hand with deformity of little finger in flexion, decreased sensation of the little finger and intrinsics.  Swelling of ulnar area distally volar distal forearm.  The patient has been educated about the nature of the problem(s) and counseled on treatment options.  The patient appeared to understand what I have discussed and is in agreement with it.  Encounter Diagnoses  Name Primary?  [name]. 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 Cigarette nicotine dependence without complication     PLAN Call if any problems.  Precautions discussed.  Continue current medications.   Return to clinic 3 months   [facility] web site prior to prescribing narcotic medicine for this patient.  Electronically Signed Darreld McleanWayne Jamarien Rodkey, MD 11/15/20183:42 PM

## 2017-04-19 ENCOUNTER — Telehealth: Payer: Self-pay | Admitting: Orthopaedic Surgery

## 2017-04-19 MED ORDER — HYDROCODONE-ACETAMINOPHEN 7.5-325 MG PO TABS: ORAL_TABLET | ORAL | 0 refills | Status: DC

## 2017-04-19 NOTE — Telephone Encounter (Signed)
Hydrocodone-Acetaminophen  7.5/325 mg  Qty 38 Tablets

## 2017-05-04 ENCOUNTER — Telehealth: Payer: Self-pay | Admitting: Orthopaedic Surgery

## 2017-05-04 MED ORDER — HYDROCODONE-ACETAMINOPHEN 7.5-325 MG PO TABS: ORAL_TABLET | ORAL | 0 refills | Status: DC

## 2017-05-04 NOTE — Telephone Encounter (Signed)
Hydrocodone-Acetaminophen  7.5/325 mg  Qty 38 Tablets  [facility]

## 2017-05-18 ENCOUNTER — Telehealth: Payer: Self-pay | Admitting: Orthopaedic Surgery

## 2017-05-18 MED ORDER — HYDROCODONE-ACETAMINOPHEN 7.5-325 MG PO TABS: ORAL_TABLET | ORAL | 0 refills | Status: DC

## 2017-05-18 NOTE — Telephone Encounter (Signed)
Hydrocodone-Acetaminophen  7.5/325 MG Qty 38 Tablets  [facility]

## 2017-06-06 ENCOUNTER — Telehealth: Payer: Self-pay | Admitting: Orthopaedic Surgery

## 2017-06-06 MED ORDER — HYDROCODONE-ACETAMINOPHEN 7.5-325 MG PO TABS: ORAL_TABLET | ORAL | 0 refills | Status: DC

## 2017-06-06 NOTE — Telephone Encounter (Signed)
Patient requests refill on Hydrocodone/Acetaminophen 7.5-325  Mgs.   Qty 36  Sig: One every six hours for pain as needed. Do not drive car or operate machinery while taking this medicine. Must last 14 days.          [facility]

## 2017-06-21 ENCOUNTER — Encounter: Payer: Self-pay | Admitting: Orthopaedic Surgery

## 2017-06-21 ENCOUNTER — Ambulatory Visit (INDEPENDENT_AMBULATORY_CARE_PROVIDER_SITE_OTHER): Payer: Medicare Other | Admitting: Orthopaedic Surgery

## 2017-06-21 VITALS — BP 116/64 | HR 52 | Ht 73.0 in | Wt 157.0 lb

## 2017-06-21 DIAGNOSIS — G5622 Lesion of ulnar nerve, left upper limb: Secondary | ICD-10-CM

## 2017-06-21 DIAGNOSIS — F17210 Nicotine dependence, cigarettes, uncomplicated: Secondary | ICD-10-CM | POA: Diagnosis not present

## 2017-06-21 MED ORDER — HYDROCODONE-ACETAMINOPHEN 7.5-325 MG PO TABS: ORAL_TABLET | ORAL | 0 refills | Status: DC

## 2017-06-21 NOTE — Progress Notes (Signed)
[name]:Erskine Mies, male DOB:04/19/1961, 57 y.o. AVW:[id number]  Chief Complaint  Patient presents with  . Follow-up    left hand     HPI  Camillo FlamingSteve Burget is a 57 y.o. 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  He has no new trauma.  The cold weather made it worse.   HPI  Body mass index is 20.71 kg/m.  ROS  Review of Systems  HENT: Negative for congestion.   Respiratory: Negative for cough and shortness of breath.   Cardiovascular: Negative for chest pain and leg swelling.  Endocrine: Negative for cold intolerance.  Musculoskeletal: Positive for arthralgias, gait problem and joint swelling.  Allergic/Immunologic: Negative for environmental allergies.    History reviewed. No pertinent past medical history.  History reviewed. No pertinent surgical history.  History reviewed. No pertinent family history.  Social History Social History   Tobacco Use  . Smoking status: Current Every Day Smoker  . Smokeless tobacco: Never Used  Substance Use Topics  . Alcohol use: Not on file  . Drug use: Not on file    No Known Allergies  Current Outpatient Medications  Medication Sig Dispense Refill  . HYDROcodone-acetaminophen (NORCO) 7.5-325 MG tablet One every six hours for pain as needed.  Do not drive car or operate machinery while taking this medicine.  Must last 14 days. 36 tablet 0   No current facility-administered medications for this visit.      Physical Exam  Blood pressure 116/64, pulse (!) 52, height 6\' 1"  (1.854 m), weight 157 lb (71.2 kg).  Constitutional: overall normal hygiene, normal nutrition, well developed, normal grooming, normal body habitus. Assistive device:none  Musculoskeletal: gait and station Limp none, muscle tone and strength are normal, no tremors or atrophy is present.  .  Neurological: coordination overall normal.  Deep tendon reflex/nerve stretch intact.  Sensation  normal.  Cranial nerves II-XII intact.   Skin:   Normal overall no scars, lesions, ulcers or rashes. No psoriasis.  Psychiatric: Alert and oriented x 3.  Recent memory intact, remote memory unclear.  Normal mood and affect. Well groomed.  Good eye contact.  Cardiovascular: overall no swelling, no varicosities, no edema bilaterally, normal temperatures of the legs and arms, no clubbing, cyanosis and good capillary refill.  Lymphatic: palpation is normal.  The left little finger has deformity in flexion.  He has no sensation of the finger.  Motion of the other fingers is normal.  Lateral palm is numb and tender.  All other systems reviewed and are negative   The patient has been educated about the nature of the problem(s) and counseled on treatment options.  The patient appeared to understand what I have discussed and is in agreement with it.  Encounter Diagnoses  Name Primary?  [name]. 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 Cigarette nicotine dependence without complication     PLAN Call if any problems.  Precautions discussed.  Continue current medications.   Return to clinic 4 months.   I have reviewed the [place]  Controlled Substance Reporting System web site prior to prescribing narcotic medicine for this patient.  Electronically Signed Darreld McleanWayne Ausencio Vaden, MD 2/6/20194:28 PM

## 2017-06-21 NOTE — Patient Instructions (Addendum)
Steps to Quit Smoking Smoking tobacco can be bad for your health. It can also affect almost every organ in your body. Smoking puts you and people around you at risk for many serious Frank Baldwin-lasting (chronic) diseases. Quitting smoking is hard, but it is one of the best things that you can do for your health. It is never too late to quit. What are the benefits of quitting smoking? When you quit smoking, you lower your risk for getting serious diseases and conditions. They can include:  Lung cancer or lung disease.  Heart disease.  Stroke.  Heart attack.  Not being able to have children (infertility).  Weak bones (osteoporosis) and broken bones (fractures).  If you have coughing, wheezing, and shortness of breath, those symptoms may get better when you quit. You may also get sick less often. If you are pregnant, quitting smoking can help to lower your chances of having a baby of low birth weight. What can I do to help me quit smoking? Talk with your doctor about what can help you quit smoking. Some things you can do (strategies) include:  Quitting smoking totally, instead of slowly cutting back how much you smoke over a period of time.  Going to in-person counseling. You are more likely to quit if you go to many counseling sessions.  Using resources and support systems, such as: ? Online chats with a counselor. ? Phone quitlines. ? Printed self-help materials. ? Support groups or group counseling. ? Text messaging programs. ? Mobile phone apps or applications.  Taking medicines. Some of these medicines may have nicotine in them. If you are pregnant or breastfeeding, do not take any medicines to quit smoking unless your doctor says it is okay. Talk with your doctor about counseling or other things that can help you.  Talk with your doctor about using more than one strategy at the same time, such as taking medicines while you are also going to in-person counseling. This can help make  quitting easier. What things can I do to make it easier to quit? Quitting smoking might feel very hard at first, but there is a lot that you can do to make it easier. Take these steps:  Talk to your family and friends. Ask them to support and encourage you.  Call phone quitlines, reach out to support groups, or work with a counselor.  Ask people who smoke to not smoke around you.  Avoid places that make you want (trigger) to smoke, such as: ? Bars. ? Parties. ? Smoke-break areas at work.  Spend time with people who do not smoke.  Lower the stress in your life. Stress can make you want to smoke. Try these things to help your stress: ? Getting regular exercise. ? Deep-breathing exercises. ? Yoga. ? Meditating. ? Doing a body scan. To do this, close your eyes, focus on one area of your body at a time from head to toe, and notice which parts of your body are tense. Try to relax the muscles in those areas.  Download or buy apps on your mobile phone or tablet that can help you stick to your quit plan. There are many free apps, such as QuitGuide from the CDC (Centers for Disease Control and Prevention). You can find more support from smokefree.gov and other websites.  This information is not intended to replace advice given to you by your health care provider. Make sure you discuss any questions you have with your health care provider. Document Released: 02/26/2009 Document   Revised: 12/29/2015 Document Reviewed: 09/16/2014 Elsevier Interactive Patient Education  2018 [employer]  

## 2017-07-06 ENCOUNTER — Telehealth: Payer: Self-pay | Admitting: Orthopaedic Surgery

## 2017-07-06 MED ORDER — HYDROCODONE-ACETAMINOPHEN 7.5-325 MG PO TABS: ORAL_TABLET | ORAL | 0 refills | Status: DC

## 2017-07-06 NOTE — Telephone Encounter (Signed)
Hydrocodone-Acetaminophen  7.5/325 mg  Qty 36 Tablets   [facility]

## 2018-06-05 ENCOUNTER — Encounter: Payer: Self-pay | Admitting: Orthopaedic Surgery

## 2018-06-05 ENCOUNTER — Ambulatory Visit (INDEPENDENT_AMBULATORY_CARE_PROVIDER_SITE_OTHER): Payer: Medicare Other | Admitting: Orthopaedic Surgery

## 2018-06-05 VITALS — BP 126/73 | HR 67 | Ht 73.0 in | Wt 150.0 lb

## 2018-06-05 DIAGNOSIS — F17210 Nicotine dependence, cigarettes, uncomplicated: Secondary | ICD-10-CM

## 2018-06-05 DIAGNOSIS — G5622 Lesion of ulnar nerve, left upper limb: Secondary | ICD-10-CM

## 2018-06-05 DIAGNOSIS — S61432A Puncture wound without foreign body of left hand, initial encounter: Secondary | ICD-10-CM

## 2018-06-05 MED ORDER — HYDROCODONE-ACETAMINOPHEN 7.5-325 MG PO TABS: ORAL_TABLET | ORAL | 0 refills | Status: AC

## 2018-06-05 NOTE — Progress Notes (Signed)
Patient Frank Baldwin, male DOB:12-23-1960, 58 y.o. [id number]  Chief Complaint  Patient presents with  . Hand Injury    left hand vs nail 06/03/18     HPI  Frank Baldwin is a 58 y.o. male who had a nail puncture wound to the left hand near the thumb thenar space and web space.  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  He has no discharge from the hand wound and the swelling has gone down.  He did this two days ago.  He has no other injury.   Body mass index is 19.79 kg/m.  ROS  Review of Systems  HENT: Negative for congestion.   Respiratory: Negative for cough and shortness of breath.   Cardiovascular: Negative for chest pain and leg swelling.  Endocrine: Negative for cold intolerance.  Musculoskeletal: Positive for arthralgias, gait problem and joint swelling.  Allergic/Immunologic: Negative for environmental allergies.    All other systems reviewed and are negative.  The following is a summary of the past history medically, past history surgically, known current medicines, social history and family history.  This information is gathered electronically by the computer from prior information and documentation.  I review this each visit and have found including this information at this point in the chart is beneficial and informative.    History reviewed. No pertinent past medical history.  History reviewed. No pertinent surgical history.  Family History  Family history unknown: Yes    Social History Social History   Tobacco Use  . Smoking status: Current Every Day Smoker  . Smokeless tobacco: Never Used  Substance Use Topics  . Alcohol use: Not on file  . Drug use: Not on file    No Known Allergies  Current Outpatient Medications  Medication Sig Dispense Refill  . HYDROcodone-acetaminophen (NORCO) 7.5-325 MG tablet One every six hours for pain as needed.  Must last 14 days. 30 tablet 0   No current facility-administered  medications for this visit.      Physical Exam  Blood pressure 126/73, pulse 67, height 6\' 1"  (1.854 m), weight 150 lb (68 kg).  Constitutional: overall normal hygiene, normal nutrition, well developed, normal grooming, normal body habitus. Assistive device:none  Musculoskeletal: gait and station Limp none, muscle tone and strength are normal, no tremors or atrophy is present.  .  Neurological: coordination overall normal.  Deep tendon reflex/nerve stretch intact.  Sensation normal.  Cranial nerves II-XII intact.   Skin:   Normal overall no scars, lesions, ulcers or rashes. No psoriasis.  Psychiatric: Alert and oriented x 3.  Recent memory intact, remote memory unclear.  Normal mood and affect. Well groomed.  Good eye contact.  Cardiovascular: overall no swelling, no varicosities, no edema bilaterally, normal temperatures of the legs and arms, no clubbing, cyanosis and good capillary refill.  Lymphatic: palpation is normal.  Left hand with small puncture wound palm side at thenar eminence and web space.  No drainage or swelling is present.  He has left hand ulnar nerve insensitivity and deformity of the left little finger.  ROM of the thumb is full.  He has no redness.  I will give samples of doxycycline to take.  All other systems reviewed and are negative   The patient has been educated about the nature of the problem(s) and counseled on treatment options.  The patient appeared to understand what I have discussed and is in agreement with it.  Encounter Diagnoses  Name Primary?  [name]  Puncture wound of left hand without foreign body, initial encounter Yes  . 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 Cigarette nicotine dependence without complication     PLAN Call if any problems.  Precautions discussed.  Continue current medications.   Return to clinic 1 week   I have reviewed the [facility] Controlled Substance Reporting System web site prior to prescribing narcotic  medicine for this patient.    Electronically Signed Darreld Mclean, MD 1/21/20203:15 PM

## 2019-08-30 ENCOUNTER — Ambulatory Visit: Payer: Medicare Other | Attending: Internal Medicine

## 2019-08-30 DIAGNOSIS — Z23 Encounter for immunization: Secondary | ICD-10-CM

## 2019-08-30 NOTE — Progress Notes (Signed)
   Covid-19 Vaccination Clinic  Name:  Frank Baldwin    MRN: [id number] DOB: 12-31-1960  08/30/2019  Mr. Frank Baldwin was observed post Covid-19 immunization for 15 minutes without incident. He was provided with Vaccine Information Sheet and instruction to access the V-Safe system.   Mr. Frank Baldwin was instructed to call 911 with any severe reactions post vaccine: [name] Difficulty breathing  . Swelling of face and throat  . A fast heartbeat  . A bad rash all over body  . Dizziness and weakness   Immunizations Administered    Name Date Dose VIS Date Route   Moderna COVID-19 Vaccine 08/30/2019  8:28 AM 0.5 mL 04/16/2019 Intramuscular   Manufacturer: Moderna   Lot: [id number]   NDC: 63817-711-65

## 2019-10-01 ENCOUNTER — Ambulatory Visit: Payer: Medicare Other | Attending: Internal Medicine

## 2019-10-01 DIAGNOSIS — Z23 Encounter for immunization: Secondary | ICD-10-CM

## 2019-10-01 NOTE — Progress Notes (Signed)
   Covid-19 Vaccination Clinic  Name:  Frank Baldwin    MRN: [id number] DOB: 1961/02/11  10/01/2019  [name] was observed post Covid-19 immunization for 15 minutes without incident. He was provided with Vaccine Information Sheet and instruction to access the V-Safe system.   [name] was instructed to call 911 with any severe reactions post vaccine: [name] Difficulty breathing  . Swelling of face and throat  . A fast heartbeat  . A bad rash all over body  . Dizziness and weakness   Immunizations Administered    Name Date Dose VIS Date Route   Moderna COVID-19 Vaccine 10/01/2019 10:00 AM 0.5 mL 04/2019 Intramuscular   Manufacturer: Moderna   Lot: [id number]   NDC: 91225-834-62

## 2021-03-01 ENCOUNTER — Telehealth: Payer: Self-pay | Admitting: Orthopaedic Surgery

## 2021-03-01 NOTE — Telephone Encounter (Signed)
Patient had dropped off an envelope for our office requesting that Dr Hilda Lias review and fill out. Upon review by our practice administrator when she returned to clinic today - it is a disability application form which our clinic does not fill out. We can provide records with the appropriate signed release request form.  I called patient - left a message to return call and to pick up.

## 2021-03-04 NOTE — Telephone Encounter (Signed)
Per Dr Hilda Lias with patient, the form is an update; form done; notified patient (scanned in copy)
